# Patient Record
Sex: Female | Born: 1990 | Race: White | Hispanic: No | Marital: Married | Smoking: Never smoker
Health system: Southern US, Community
[De-identification: ages and names within clinical notes are randomized; demographics above are authoritative.]

## PROBLEM LIST (undated history)

## (undated) DIAGNOSIS — Z789 Other specified health status: Secondary | ICD-10-CM

## (undated) HISTORY — PX: BREAST REDUCTION SURGERY: SHX8

---
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
## 2009-12-25 ENCOUNTER — Other Ambulatory Visit: Payer: Self-pay | Admitting: Emergency Medicine

## 2009-12-25 ENCOUNTER — Inpatient Hospital Stay (HOSPITAL_COMMUNITY)
Admission: AD | Admit: 2009-12-25 | Discharge: 2009-12-25 | Payer: Self-pay | Source: Home / Self Care | Admitting: Obstetrics & Gynecology

## 2010-05-15 DIAGNOSIS — Z87442 Personal history of urinary calculi: Secondary | ICD-10-CM

## 2010-05-15 HISTORY — DX: Personal history of urinary calculi: Z87.442

## 2010-06-08 ENCOUNTER — Emergency Department (HOSPITAL_BASED_OUTPATIENT_CLINIC_OR_DEPARTMENT_OTHER)
Admission: EM | Admit: 2010-06-08 | Discharge: 2010-06-08 | Payer: Self-pay | Source: Home / Self Care | Admitting: Emergency Medicine

## 2010-06-08 LAB — URINE MICROSCOPIC-ADD ON

## 2010-06-08 LAB — URINALYSIS, ROUTINE W REFLEX MICROSCOPIC
Bilirubin Urine: NEGATIVE
Protein, ur: NEGATIVE mg/dL
Specific Gravity, Urine: 1.011 (ref 1.005–1.030)
Urine Glucose, Fasting: NEGATIVE mg/dL
pH: 6.5 (ref 5.0–8.0)

## 2010-07-19 ENCOUNTER — Emergency Department (HOSPITAL_BASED_OUTPATIENT_CLINIC_OR_DEPARTMENT_OTHER)
Admission: EM | Admit: 2010-07-19 | Discharge: 2010-07-19 | Disposition: A | Discharge status: Nursing Home (Includes ICF) | Payer: Medicaid Other | Source: Home / Self Care | Attending: Emergency Medicine | Admitting: Emergency Medicine

## 2010-07-19 ENCOUNTER — Inpatient Hospital Stay (HOSPITAL_COMMUNITY)
Admission: AD | Admit: 2010-07-19 | Discharge: 2010-07-22 | DRG: 781 | Disposition: A | Discharge status: Home / Self Care | Payer: Medicaid Other | Source: Other Acute Inpatient Hospital | Attending: Obstetrics | Admitting: Obstetrics

## 2010-07-19 DIAGNOSIS — M549 Dorsalgia, unspecified: Secondary | ICD-10-CM | POA: Insufficient documentation

## 2010-07-19 DIAGNOSIS — O2690 Pregnancy related conditions, unspecified, unspecified trimester: Secondary | ICD-10-CM | POA: Insufficient documentation

## 2010-07-19 DIAGNOSIS — N390 Urinary tract infection, site not specified: Secondary | ICD-10-CM | POA: Insufficient documentation

## 2010-07-19 DIAGNOSIS — N12 Tubulo-interstitial nephritis, not specified as acute or chronic: Secondary | ICD-10-CM | POA: Diagnosis present

## 2010-07-19 DIAGNOSIS — O2390 Unspecified genitourinary tract infection in pregnancy, unspecified trimester: Principal | ICD-10-CM | POA: Diagnosis present

## 2010-07-19 DIAGNOSIS — R109 Unspecified abdominal pain: Secondary | ICD-10-CM | POA: Diagnosis present

## 2010-07-19 LAB — CBC
Hemoglobin: 10.9 g/dL — ABNORMAL LOW (ref 12.0–15.0)
MCHC: 33.3 g/dL (ref 30.0–36.0)
MCV: 90.6 fL (ref 78.0–100.0)
RBC: 3.61 MIL/uL — ABNORMAL LOW (ref 3.87–5.11)
WBC: 20.9 10*3/uL — ABNORMAL HIGH (ref 4.0–10.5)

## 2010-07-19 LAB — URINALYSIS, ROUTINE W REFLEX MICROSCOPIC
Ketones, ur: NEGATIVE mg/dL
Specific Gravity, Urine: 1.01 (ref 1.005–1.030)
pH: 7.5 (ref 5.0–8.0)

## 2010-07-19 LAB — URINE MICROSCOPIC-ADD ON

## 2010-07-20 LAB — CBC
Hemoglobin: 10.7 g/dL — ABNORMAL LOW (ref 12.0–15.0)
MCV: 91.6 fL (ref 78.0–100.0)
WBC: 19 10*3/uL — ABNORMAL HIGH (ref 4.0–10.5)

## 2010-07-21 LAB — URINE CULTURE
Culture  Setup Time: 201203061305
Special Requests: POSITIVE

## 2010-07-21 LAB — CLOSTRIDIUM DIFFICILE BY PCR: Toxigenic C. Difficile by PCR: NEGATIVE

## 2010-07-29 LAB — GC/CHLAMYDIA PROBE AMP, GENITAL: Chlamydia, DNA Probe: NEGATIVE

## 2010-07-29 LAB — WET PREP, GENITAL: Yeast Wet Prep HPF POC: NONE SEEN

## 2010-08-02 NOTE — Discharge Summary (Signed)
  NAMEERVA, KOKE               ACCOUNT NO.:  [id number]  MEDICAL RECORD NO.:  [id number]           PATIENT TYPE:  I  LOCATION:  9157                          [facility]ACILITY:  [facility]  PHYSICIAN:  Sayeed Weatherall A. Clearance Coots, M.D.DATE OF BIRTH:  March 29, 1991  DATE OF ADMISSION:  07/19/2010 DATE OF DISCHARGE:  07/22/2010                              DISCHARGE SUMMARY   ADMITTING DIAGNOSIS:  Right pyelonephritis.  DISCHARGE DIAGNOSIS:  Right pyelonephritis, improved after IV antibiotic therapy.  REASON FOR ADMISSION:  A 19-year G1, estimated date of confinement August 24, 2010 presents with lower middle abdominal cramping and back pain and burning with urination.  Denies fever, chills, or nausea and vomiting.  PAST MEDICAL HISTORY:  None.  PAST SURGICAL HISTORY:  None.  ILLNESSES:  Epilepsy and syncopal.  MEDICATIONS:  Prenatal vitamins.  ALLERGIES:  No known drug allergies.  SOCIAL HISTORY:  Negative tobacco, alcohol, or recreational drug use. Single.  FAMILY HISTORY:  No major illnesses listed.  REVIEW OF SYSTEMS:  MUSCULOSKELETAL SYSTEM:  Back pain.  GENITOURINARY SYSTEM:  Dysuria.  PHYSICAL EXAMINATION:  GENERAL:  Well-nourished, well-developed female in no acute distress. VITAL SIGNS:  Afebrile, blood pressure 129/84. LUNGS:  Clear to auscultation bilaterally. HEART:  Regular rate and rhythm. BACK:  Positive right CVA tenderness.  Cervix 3 cm dilated, long.  ADMITTING LABS:  Hemoglobin 10, hematocrit 33, white blood cell count 20,000, platelets 228,000.  Urinalysis was positive for nitrites, moderate blood, large leukocytes.  HOSPITAL COURSE:  The patient was admitted and started on Rocephin IV, responded well to therapy.  Urine culture came back negative.  The patient developed diarrhea on hospital day #3 that resolved with p.o. Flagyl.  Clostridium difficile cultures came back negative.  Urine culture was negative.  The patient was much improved by hospital day #4 and was  discharged home undelivered at [redacted] weeks gestation, much improved in good condition.  DISCHARGE DISPOSITION:  Medications:  Continue prenatal vitamins.  The patient is to call office for followup appointment in 1 week.     Analiza Cowger A. Clearance Coots, M.D.     CAH/MEDQ  D:  07/22/2010  T:  07/23/2010  Job:  [id number]  Electronically Signed by Coral Ceo M.D. on 08/02/2010 01:31:22 PM

## 2010-08-14 ENCOUNTER — Inpatient Hospital Stay (HOSPITAL_COMMUNITY)
Admission: AD | Admit: 2010-08-14 | Discharge: 2010-08-16 | DRG: 775 | Disposition: A | Discharge status: Home / Self Care | Payer: Medicaid Other | Source: Ambulatory Visit | Attending: Obstetrics & Gynecology | Admitting: Obstetrics & Gynecology

## 2010-08-14 LAB — ABO/RH: ABO/RH(D): O POS

## 2010-08-14 LAB — CBC
HCT: 37.5 % (ref 36.0–46.0)
MCH: 30.4 pg (ref 26.0–34.0)
MCHC: 34.1 g/dL (ref 30.0–36.0)
Platelets: 251 10*3/uL (ref 150–400)
RBC: 4.21 MIL/uL (ref 3.87–5.11)
RDW: 13.2 % (ref 11.5–15.5)

## 2010-08-14 LAB — RPR: RPR Ser Ql: NONREACTIVE

## 2010-08-15 LAB — CBC
HCT: 30.3 % — ABNORMAL LOW (ref 36.0–46.0)
MCH: 29.6 pg (ref 26.0–34.0)
MCHC: 32.3 g/dL (ref 30.0–36.0)
RBC: 3.31 MIL/uL — ABNORMAL LOW (ref 3.87–5.11)
WBC: 15.6 10*3/uL — ABNORMAL HIGH (ref 4.0–10.5)

## 2010-08-17 NOTE — Op Note (Signed)
  NAMESHENOA, HATTABAUGH               ACCOUNT NO.:  [id number]  MEDICAL RECORD NO.:  [id number]           PATIENT TYPE:  I  LOCATION:  9162                          [facility]  [facility]  PHYSICIAN:  Roseanna Rainbow, M.D.DATE OF BIRTH:  1991-02-26  DATE OF PROCEDURE:  08/14/2010 DATE OF DISCHARGE:                              OPERATIVE REPORT   DELIVERY NOTE:  The patient was fully dilated and pushing.  There were repetitive severe variable decelerations.  There was a terminal bradycardia with the heart rate in the 80 beat per minute range. The decision was made to proceed with a vacuum-assisted delivery.  The patient was informed and consent was given in an expedited manner.  The position of the fetal head was direct occiput anterior.  The head was crowning.  The Kiwi vacuum was then applied and with one expulsive effort, the head was delivered as well as the anterior shoulder. Neonatology was present.  The cord was clamped and cut.  The infant was placed on the maternal abdomen and chest.  The placenta was delivered with cord traction intact with a three-vessel cord.  There was a right labial tear as well as second-degree perineal laceration.  These were repaired in the usual fashion using 2-0 and 3-0 Vicryl Rapide.  The estimated blood loss was less than 500 mL.  The mother and infant were in stable condition.     Roseanna Rainbow, M.D.     Judee Clara  D:  08/14/2010  T:  08/14/2010  Job:  [id number]  Electronically Signed by Antionette Char M.D. on 08/17/2010 10:14:01 PM

## 2010-09-11 ENCOUNTER — Emergency Department (INDEPENDENT_AMBULATORY_CARE_PROVIDER_SITE_OTHER): Payer: Medicaid Other

## 2010-09-11 ENCOUNTER — Emergency Department (HOSPITAL_BASED_OUTPATIENT_CLINIC_OR_DEPARTMENT_OTHER)
Admission: EM | Admit: 2010-09-11 | Discharge: 2010-09-11 | Disposition: A | Discharge status: Home / Self Care | Payer: Medicaid Other | Attending: Emergency Medicine | Admitting: Emergency Medicine

## 2010-09-11 DIAGNOSIS — N200 Calculus of kidney: Secondary | ICD-10-CM | POA: Insufficient documentation

## 2010-09-11 DIAGNOSIS — R109 Unspecified abdominal pain: Secondary | ICD-10-CM | POA: Insufficient documentation

## 2010-09-11 DIAGNOSIS — N201 Calculus of ureter: Secondary | ICD-10-CM

## 2010-09-11 LAB — CBC
MCH: 29.9 pg (ref 26.0–34.0)
Platelets: 320 10*3/uL (ref 150–400)
RBC: 4.11 MIL/uL (ref 3.87–5.11)

## 2010-09-11 LAB — URINALYSIS, ROUTINE W REFLEX MICROSCOPIC
Glucose, UA: NEGATIVE mg/dL
Ketones, ur: NEGATIVE mg/dL
Protein, ur: NEGATIVE mg/dL
Specific Gravity, Urine: 1.018 (ref 1.005–1.030)
pH: 5.5 (ref 5.0–8.0)

## 2010-09-11 LAB — DIFFERENTIAL
Eosinophils Absolute: 0.1 10*3/uL (ref 0.0–0.7)
Lymphocytes Relative: 30 % (ref 12–46)
Monocytes Relative: 9 % (ref 3–12)
Neutro Abs: 5.4 10*3/uL (ref 1.7–7.7)
Neutrophils Relative %: 60 % (ref 43–77)

## 2010-09-11 LAB — COMPREHENSIVE METABOLIC PANEL
CO2: 27 mEq/L (ref 19–32)
Calcium: 9 mg/dL (ref 8.4–10.5)
Chloride: 106 mEq/L (ref 96–112)
GFR calc non Af Amer: >60 mL/min (ref >60)
Total Bilirubin: 1 mg/dL (ref 0.3–1.2)

## 2010-09-11 LAB — URINE MICROSCOPIC-ADD ON

## 2011-06-10 ENCOUNTER — Ambulatory Visit (INDEPENDENT_AMBULATORY_CARE_PROVIDER_SITE_OTHER): Payer: No Typology Code available for payment source

## 2011-06-10 DIAGNOSIS — R1032 Left lower quadrant pain: Secondary | ICD-10-CM

## 2011-06-10 DIAGNOSIS — K5900 Constipation, unspecified: Secondary | ICD-10-CM

## 2011-09-07 ENCOUNTER — Emergency Department (INDEPENDENT_AMBULATORY_CARE_PROVIDER_SITE_OTHER): Payer: No Typology Code available for payment source

## 2011-09-07 ENCOUNTER — Emergency Department (HOSPITAL_BASED_OUTPATIENT_CLINIC_OR_DEPARTMENT_OTHER)
Admission: EM | Admit: 2011-09-07 | Discharge: 2011-09-07 | Disposition: A | Discharge status: Home / Self Care | Payer: No Typology Code available for payment source | Attending: Emergency Medicine | Admitting: Emergency Medicine

## 2011-09-07 ENCOUNTER — Encounter (HOSPITAL_BASED_OUTPATIENT_CLINIC_OR_DEPARTMENT_OTHER): Payer: Self-pay | Admitting: *Deleted

## 2011-09-07 DIAGNOSIS — IMO0002 Reserved for concepts with insufficient information to code with codable children: Secondary | ICD-10-CM | POA: Insufficient documentation

## 2011-09-07 DIAGNOSIS — S60222A Contusion of left hand, initial encounter: Secondary | ICD-10-CM

## 2011-09-07 DIAGNOSIS — Z043 Encounter for examination and observation following other accident: Secondary | ICD-10-CM

## 2011-09-07 DIAGNOSIS — S0990XA Unspecified injury of head, initial encounter: Secondary | ICD-10-CM | POA: Insufficient documentation

## 2011-09-07 DIAGNOSIS — M79609 Pain in unspecified limb: Secondary | ICD-10-CM

## 2011-09-07 DIAGNOSIS — R404 Transient alteration of awareness: Secondary | ICD-10-CM

## 2011-09-07 DIAGNOSIS — S1093XA Contusion of unspecified part of neck, initial encounter: Secondary | ICD-10-CM | POA: Insufficient documentation

## 2011-09-07 DIAGNOSIS — S5010XA Contusion of unspecified forearm, initial encounter: Secondary | ICD-10-CM | POA: Insufficient documentation

## 2011-09-07 DIAGNOSIS — S8001XA Contusion of right knee, initial encounter: Secondary | ICD-10-CM

## 2011-09-07 DIAGNOSIS — R51 Headache: Secondary | ICD-10-CM

## 2011-09-07 DIAGNOSIS — S8000XA Contusion of unspecified knee, initial encounter: Secondary | ICD-10-CM | POA: Insufficient documentation

## 2011-09-07 DIAGNOSIS — M25569 Pain in unspecified knee: Secondary | ICD-10-CM

## 2011-09-07 DIAGNOSIS — S0003XA Contusion of scalp, initial encounter: Secondary | ICD-10-CM | POA: Insufficient documentation

## 2011-09-07 DIAGNOSIS — S8002XA Contusion of left knee, initial encounter: Secondary | ICD-10-CM

## 2011-09-07 DIAGNOSIS — M25539 Pain in unspecified wrist: Secondary | ICD-10-CM | POA: Insufficient documentation

## 2011-09-07 DIAGNOSIS — S5012XA Contusion of left forearm, initial encounter: Secondary | ICD-10-CM

## 2011-09-07 DIAGNOSIS — S60229A Contusion of unspecified hand, initial encounter: Secondary | ICD-10-CM | POA: Insufficient documentation

## 2011-09-07 MED ORDER — HYDROCODONE-ACETAMINOPHEN 5-500 MG PO TABS: 1.0000 | ORAL_TABLET | Freq: Four times a day (QID) | ORAL | Status: AC | PRN
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
## 2011-09-07 NOTE — ED Notes (Signed)
MD at bedside. 
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
## 2021-04-14 HISTORY — PX: BREAST REDUCTION SURGERY: SHX8

## 2022-04-03 ENCOUNTER — Encounter (HOSPITAL_COMMUNITY): Payer: Self-pay | Admitting: Family Medicine

## 2022-04-03 ENCOUNTER — Inpatient Hospital Stay (HOSPITAL_COMMUNITY)
Admission: AD | Admit: 2022-04-03 | Discharge: 2022-04-03 | Disposition: A | Discharge status: Home / Self Care | Payer: 59 | Attending: Family Medicine | Admitting: Family Medicine

## 2022-04-03 ENCOUNTER — Inpatient Hospital Stay (HOSPITAL_COMMUNITY): Payer: 59

## 2022-04-03 DIAGNOSIS — A5901 Trichomonal vulvovaginitis: Secondary | ICD-10-CM | POA: Diagnosis not present

## 2022-04-03 DIAGNOSIS — O209 Hemorrhage in early pregnancy, unspecified: Secondary | ICD-10-CM | POA: Diagnosis present

## 2022-04-03 DIAGNOSIS — O2691 Pregnancy related conditions, unspecified, first trimester: Secondary | ICD-10-CM | POA: Diagnosis not present

## 2022-04-03 DIAGNOSIS — Z3A12 12 weeks gestation of pregnancy: Secondary | ICD-10-CM | POA: Insufficient documentation

## 2022-04-03 HISTORY — DX: Other specified health status: Z78.9

## 2022-04-03 LAB — URINALYSIS, ROUTINE W REFLEX MICROSCOPIC
Bacteria, UA: NONE SEEN
Bilirubin Urine: NEGATIVE
Glucose, UA: NEGATIVE mg/dL
Ketones, ur: NEGATIVE mg/dL
Nitrite: NEGATIVE
Protein, ur: NEGATIVE mg/dL
Specific Gravity, Urine: 1.018 (ref 1.005–1.030)
pH: 5 (ref 5.0–8.0)

## 2022-04-03 LAB — WET PREP, GENITAL
Clue Cells Wet Prep HPF POC: NONE SEEN
Sperm: NONE SEEN
WBC, Wet Prep HPF POC: <10 (ref <10)
Yeast Wet Prep HPF POC: NONE SEEN

## 2022-04-03 LAB — CBC
HCT: 40.6 % (ref 36.0–46.0)
Hemoglobin: 13.8 g/dL (ref 12.0–15.0)
MCH: 31.1 pg (ref 26.0–34.0)
MCHC: 34 g/dL (ref 30.0–36.0)
MCV: 91.4 fL (ref 80.0–100.0)
Platelets: 314 10*3/uL (ref 150–400)
RBC: 4.44 MIL/uL (ref 3.87–5.11)
RDW: 11.4 % — ABNORMAL LOW (ref 11.5–15.5)
WBC: 10.6 10*3/uL — ABNORMAL HIGH (ref 4.0–10.5)
nRBC: 0 % (ref 0.0–0.2)

## 2022-04-03 LAB — HCG, QUANTITATIVE, PREGNANCY: hCG, Beta Chain, Quant, S: 2671 m[IU]/mL — ABNORMAL HIGH (ref <5)

## 2022-04-03 MED ORDER — METRONIDAZOLE 500 MG PO TABS
2000.0000 mg | ORAL_TABLET | Freq: Once | ORAL | Status: AC
  Administered 2022-04-03: 2000 mg via ORAL
  Filled 2022-04-03: qty 4

## 2022-04-03 NOTE — MAU Note (Signed)
.  Monica Leonard is a 31 y.o. at [redacted]w[redacted]d here in MAU reporting: u/s at 3-4 weeks at pregnancy care center showed ? Twins, repeat u/s last week shows two sacks but still no ? Fetal pole. Denies pain, light spotting since yesterday pm. Only sees the bleeding when she wipes.   Onset of complaint: yesterday Pain score: 0/10 Vitals:   04/03/22 1406  BP: (!) 143/87  Pulse: 98  Resp: 15  Temp: 98.8 F (37.1 C)  SpO2: 98%     FHT:na Lab orders placed from triage:  urine

## 2022-04-03 NOTE — MAU Provider Note (Cosign Needed Addendum)
History     CSN: [id number]  Arrival date and time: 04/03/22 1336   Event Date/Time   First Provider Initiated Contact with Patient 04/03/22 1422      Chief Complaint  Patient presents with   Vaginal Bleeding   Monica Leonard is a 31 y.o. G2P1001 female at [redacted]w[redacted]d by LMP who presents for light vaginal bleeding. She noticed it yesterday incidentally while wiping. She describes the bleeding as very light red. She has not had any pain or cramping. She recently had sexual intercourse on Saturday, 11/18. Of note, she saw [facility] and had an ultrasound that she was told shows potentially two gestational sacs and no fetal pole. Today, she would like an additional ultrasound and beta hCG levels to further assess her pregnancy.  OB History     Gravida  2   Para  1   Term  1   Preterm      AB      Living  1      SAB      IAB      Ectopic      Multiple      Live Births  1           Past Medical History:  Diagnosis Date   Medical history non-contributory     Past Surgical History:  Procedure Laterality Date   BREAST REDUCTION SURGERY Bilateral     No family history on file.  Social History   Tobacco Use   Smoking status: Never  Vaping Use   Vaping Use: Never used  Substance Use Topics   Alcohol use: No   Drug use: No    Allergies: No Known Allergies  Medications Prior to Admission  Medication Sig Dispense Refill Last Dose   Prenatal Vit-Fe Fumarate-FA (MULTIVITAMIN-PRENATAL) 27-0.8 MG TABS tablet Take 1 tablet by mouth daily at 12 noon.   04/03/2022   ROS as per HPI.  Physical Exam   Blood pressure (!) 143/87, pulse 98, temperature 98.8 F (37.1 C), temperature source Oral, resp. rate 15, height 5\' 4"  (1.626 m), weight 82.6 kg, last menstrual period 01/08/2022, SpO2 98 %.  Physical Exam Constitutional:      General: She is not in acute distress.    Appearance: Normal appearance. She is not ill-appearing or toxic-appearing.   HENT:     Head: Normocephalic and atraumatic.  Eyes:     Extraocular Movements: Extraocular movements intact.  Cardiovascular:     Rate and Rhythm: Normal rate and regular rhythm.     Heart sounds: No murmur heard.    No gallop.  Pulmonary:     Effort: Pulmonary effort is normal. No respiratory distress.     Breath sounds: Normal breath sounds.  Abdominal:     General: Bowel sounds are normal.     Palpations: Abdomen is soft.     Tenderness: There is no abdominal tenderness.  Skin:    General: Skin is warm and dry.  Neurological:     Mental Status: She is alert.  Psychiatric:        Mood and Affect: Mood normal.        Behavior: Behavior normal.    Assessment and Plan  Monica Leonard is a 31 y.o. G2P1001 female at [redacted]w[redacted]d by LMP who presents for light vaginal bleeding. She is asymptomatic. I suspect her light spotting is 2/2 cervical changes in pregnancy +/- postcoital. Do not suspect loss of pregnancy, placental abruption, vasa/placenta previa given she is  without pain and more substantial blood loss is absent. Given she will not be seen until next month for her OB and her preferences, will proceed with initial pregnancy workup.  -UA -CBC -beta hCG quant -GC/CL and wet prep -Transab/transvaginal US  UA and wet prep with trichomonas which could also be source of vaginal spotting. Discussed trichomonas and how it is spread with patient. [place] read with multiple endometrial cysts/small loculated collections, which could represent a molar pregnancy or multiple gestational sacs. No fetal pole, yolk sac, or cardiac activity visualized.  -PO metronidazole 2g once per patient preference -Informational materials on trich provided -Recommend husband be tested and treated if indicated  Quant beta hCG 5920 on 10/20 and now 2671 on check today. Most likely failed pregnancy vs molar pregnancy given declining and not severely elevated hCG. Discussed with patient need for D&C to evacuate and  test tissue; scheduler notified. Also discussed follow up planning with [facility] after the procedure for trich test of cure.  Patient is stable for discharge.  Monica Holmes, MD 04/03/2022, 2:46 PM     Attestation of Supervision of Student:  I confirm that I have verified the information documented in the  resident's . I have verified that all services and findings are accurately documented in this student's note; and I agree with management and plan as outlined in the documentation. I have also made any necessary editorial changes.  RH positive Per Care everywhere patient had HCG at urgent care on 10/20 of 5926.  Reviewed HCG & imaging with Dr. Adrian Blackwater who recommends D&E & to have tissue tested for possible molar pregnancy.   Judeth Horn, NP [facility] [employer], [facility] 04/03/2022 5:45 PM

## 2022-04-03 NOTE — Discharge Instructions (Signed)
Return to care  If you have heavier bleeding that soaks through more than 2 pads per hour for an hour or more If you bleed so much that you feel like you might pass out or you do pass out If you have significant abdominal pain that is not improved with Tylenol   

## 2022-04-04 ENCOUNTER — Telehealth: Payer: Self-pay

## 2022-04-04 ENCOUNTER — Other Ambulatory Visit (HOSPITAL_COMMUNITY): Payer: Self-pay | Admitting: Obstetrics and Gynecology

## 2022-04-04 ENCOUNTER — Other Ambulatory Visit: Payer: Self-pay

## 2022-04-04 DIAGNOSIS — O020 Blighted ovum and nonhydatidiform mole: Secondary | ICD-10-CM

## 2022-04-04 LAB — GC/CHLAMYDIA PROBE AMP (~~LOC~~) NOT AT ARMC
Chlamydia: NEGATIVE
Comment: NEGATIVE
Comment: NORMAL
Neisseria Gonorrhea: NEGATIVE

## 2022-04-04 MED ORDER — DOXYCYCLINE HYCLATE 100 MG IV SOLR
200.0000 mg | INTRAVENOUS | Status: AC
  Administered 2022-04-05: 200 mg via INTRAVENOUS
  Filled 2022-04-04: qty 200

## 2022-04-04 NOTE — Progress Notes (Signed)
Mrs Monica Leonard denies chest pain or shortness of breath.  Patient denies having any s/s of Covid in her household, also denies any known exposure to Covid.   Bridie's PCP is in [facility].

## 2022-04-04 NOTE — Telephone Encounter (Signed)
Called patient, no answer, left voicemail with surgery date, time, location, preop instructions and call back number. 

## 2022-04-05 ENCOUNTER — Ambulatory Visit (HOSPITAL_BASED_OUTPATIENT_CLINIC_OR_DEPARTMENT_OTHER): Payer: 59 | Admitting: Certified Registered"

## 2022-04-05 ENCOUNTER — Ambulatory Visit (HOSPITAL_COMMUNITY)
Admission: RE | Admit: 2022-04-05 | Discharge: 2022-04-05 | Disposition: A | Discharge status: Home / Self Care | Payer: 59 | Source: Ambulatory Visit | Attending: Obstetrics and Gynecology | Admitting: Obstetrics and Gynecology

## 2022-04-05 ENCOUNTER — Encounter (HOSPITAL_COMMUNITY)
Admission: RE | Disposition: A | Discharge status: Home / Self Care | Payer: Self-pay | Source: Ambulatory Visit | Attending: Obstetrics and Gynecology

## 2022-04-05 ENCOUNTER — Ambulatory Visit (HOSPITAL_COMMUNITY): Payer: 59 | Admitting: Certified Registered"

## 2022-04-05 DIAGNOSIS — O020 Blighted ovum and nonhydatidiform mole: Secondary | ICD-10-CM

## 2022-04-05 DIAGNOSIS — O021 Missed abortion: Secondary | ICD-10-CM | POA: Diagnosis not present

## 2022-04-05 DIAGNOSIS — Z01812 Encounter for preprocedural laboratory examination: Secondary | ICD-10-CM

## 2022-04-05 HISTORY — PX: DILATION AND EVACUATION: SHX1459

## 2022-04-05 HISTORY — PX: OPERATIVE ULTRASOUND: SHX5996

## 2022-04-05 LAB — TYPE AND SCREEN
ABO/RH(D): O POS
Antibody Screen: NEGATIVE

## 2022-04-05 LAB — ABO/RH: ABO/RH(D): O POS

## 2022-04-05 SURGERY — DILATION AND EVACUATION, UTERUS
Anesthesia: General

## 2022-04-05 MED ORDER — ONDANSETRON HCL 4 MG/2ML IJ SOLN: 4.0000 mg | Freq: Once | INTRAMUSCULAR | Status: DC | PRN

## 2022-04-05 MED ORDER — ONDANSETRON HCL 4 MG/2ML IJ SOLN
INTRAMUSCULAR | Status: AC
  Filled 2022-04-05: qty 2

## 2022-04-05 MED ORDER — OXYCODONE-ACETAMINOPHEN 5-325 MG PO TABS: 1.0000 | ORAL_TABLET | Freq: Four times a day (QID) | ORAL | 0 refills | Status: AC | PRN

## 2022-04-05 MED ORDER — MIDAZOLAM HCL 2 MG/2ML IJ SOLN
INTRAMUSCULAR | Status: AC
  Filled 2022-04-05: qty 2

## 2022-04-05 MED ORDER — FENTANYL CITRATE (PF) 250 MCG/5ML IJ SOLN
INTRAMUSCULAR | Status: AC
  Filled 2022-04-05: qty 5

## 2022-04-05 MED ORDER — CHLORHEXIDINE GLUCONATE 0.12 % MT SOLN: 15.0000 mL | Freq: Once | OROMUCOSAL | Status: AC

## 2022-04-05 MED ORDER — FENTANYL CITRATE (PF) 250 MCG/5ML IJ SOLN
INTRAMUSCULAR | Status: DC | PRN
  Administered 2022-04-05: 100 ug via INTRAVENOUS
  Administered 2022-04-05: 50 ug via INTRAVENOUS

## 2022-04-05 MED ORDER — MIDAZOLAM HCL 2 MG/2ML IJ SOLN
INTRAMUSCULAR | Status: DC | PRN
  Administered 2022-04-05: 2 mg via INTRAVENOUS

## 2022-04-05 MED ORDER — CHLOROPROCAINE HCL 1 % IJ SOLN
INTRAMUSCULAR | Status: DC | PRN
  Administered 2022-04-05: 5 mL

## 2022-04-05 MED ORDER — LIDOCAINE 2% (20 MG/ML) 5 ML SYRINGE
INTRAMUSCULAR | Status: AC
  Filled 2022-04-05: qty 5

## 2022-04-05 MED ORDER — DEXAMETHASONE SODIUM PHOSPHATE 10 MG/ML IJ SOLN
INTRAMUSCULAR | Status: AC
  Filled 2022-04-05: qty 1

## 2022-04-05 MED ORDER — FENTANYL CITRATE (PF) 100 MCG/2ML IJ SOLN: 25.0000 ug | INTRAMUSCULAR | Status: DC | PRN

## 2022-04-05 MED ORDER — LACTATED RINGERS IV SOLN: INTRAVENOUS | Status: DC

## 2022-04-05 MED ORDER — IBUPROFEN 600 MG PO TABS: 600.0000 mg | ORAL_TABLET | Freq: Four times a day (QID) | ORAL | 3 refills | Status: AC | PRN

## 2022-04-05 MED ORDER — ORAL CARE MOUTH RINSE: 15.0000 mL | Freq: Once | OROMUCOSAL | Status: AC

## 2022-04-05 MED ORDER — LIDOCAINE 2% (20 MG/ML) 5 ML SYRINGE
INTRAMUSCULAR | Status: DC | PRN
  Administered 2022-04-05: 100 mg via INTRAVENOUS

## 2022-04-05 MED ORDER — CHLORHEXIDINE GLUCONATE 0.12 % MT SOLN
OROMUCOSAL | Status: AC
  Administered 2022-04-05: 15 mL via OROMUCOSAL
  Filled 2022-04-05: qty 15

## 2022-04-05 MED ORDER — DEXAMETHASONE SODIUM PHOSPHATE 10 MG/ML IJ SOLN
INTRAMUSCULAR | Status: DC | PRN
  Administered 2022-04-05: 10 mg via INTRAVENOUS

## 2022-04-05 MED ORDER — CHLOROPROCAINE HCL 1 % IJ SOLN
INTRAMUSCULAR | Status: AC
  Filled 2022-04-05: qty 30

## 2022-04-05 MED ORDER — OXYCODONE HCL 5 MG/5ML PO SOLN: 5.0000 mg | Freq: Once | ORAL | Status: DC | PRN

## 2022-04-05 MED ORDER — ROCURONIUM BROMIDE 10 MG/ML (PF) SYRINGE
PREFILLED_SYRINGE | INTRAVENOUS | Status: AC
  Filled 2022-04-05: qty 10

## 2022-04-05 MED ORDER — PROPOFOL 10 MG/ML IV BOLUS
INTRAVENOUS | Status: AC
  Filled 2022-04-05: qty 20

## 2022-04-05 MED ORDER — POVIDONE-IODINE 10 % EX SWAB
2.0000 | Freq: Once | CUTANEOUS | Status: AC
  Administered 2022-04-05: 2 via TOPICAL

## 2022-04-05 MED ORDER — OXYCODONE HCL 5 MG PO TABS: 5.0000 mg | ORAL_TABLET | Freq: Once | ORAL | Status: DC | PRN

## 2022-04-05 MED ORDER — PROPOFOL 10 MG/ML IV BOLUS
INTRAVENOUS | Status: DC | PRN
  Administered 2022-04-05: 200 mg via INTRAVENOUS

## 2022-04-05 MED ORDER — ONDANSETRON HCL 4 MG/2ML IJ SOLN
INTRAMUSCULAR | Status: DC | PRN
  Administered 2022-04-05: 4 mg via INTRAVENOUS

## 2022-04-05 MED ORDER — KETOROLAC TROMETHAMINE 30 MG/ML IJ SOLN: 30.0000 mg | Freq: Once | INTRAMUSCULAR | Status: AC | PRN

## 2022-04-05 SURGICAL SUPPLY — 22 items
CATH ROBINSON RED A/P 16FR (CATHETERS) ×1 IMPLANT
FILTER UTR ASPR ASSEMBLY (MISCELLANEOUS) ×1 IMPLANT
GLOVE BIOGEL PI IND STRL 6.5 (GLOVE) ×1 IMPLANT
GLOVE BIOGEL PI IND STRL 7.0 (GLOVE) ×1 IMPLANT
GLOVE SURG SS PI 6.5 STRL IVOR (GLOVE) ×1 IMPLANT
GOWN STRL REUS W/ TWL LRG LVL3 (GOWN DISPOSABLE) ×2 IMPLANT
GOWN STRL REUS W/TWL LRG LVL3 (GOWN DISPOSABLE) ×2
HOSE CONNECTING 18IN BERKELEY (TUBING) ×1 IMPLANT
KIT BERKELEY 1ST TRI 3/8 NO TR (MISCELLANEOUS) ×1 IMPLANT
KIT BERKELEY 1ST TRIMESTER 3/8 (MISCELLANEOUS) ×1 IMPLANT
NS IRRIG 1000ML POUR BTL (IV SOLUTION) ×1 IMPLANT
PACK VAGINAL MINOR WOMEN LF (CUSTOM PROCEDURE TRAY) ×1 IMPLANT
PAD OB MATERNITY 4.3X12.25 (PERSONAL CARE ITEMS) ×1 IMPLANT
SET BERKELEY SUCTION TUBING (SUCTIONS) ×1 IMPLANT
SPIKE FLUID TRANSFER (MISCELLANEOUS) ×1 IMPLANT
TOWEL GREEN STERILE FF (TOWEL DISPOSABLE) ×2 IMPLANT
UNDERPAD 30X36 HEAVY ABSORB (UNDERPADS AND DIAPERS) ×1 IMPLANT
VACURETTE 10 RIGID CVD (CANNULA) IMPLANT
VACURETTE 6 ASPIR F TIP BERK (CANNULA) IMPLANT
VACURETTE 7MM CVD STRL WRAP (CANNULA) IMPLANT
VACURETTE 8 RIGID CVD (CANNULA) IMPLANT
VACURETTE 9 RIGID CVD (CANNULA) IMPLANT

## 2022-04-05 NOTE — Anesthesia Postprocedure Evaluation (Signed)
Anesthesia Post Note  Patient: Monica Leonard  Procedure(s) Performed: DILATATION AND EVACUATION OPERATIVE ULTRASOUND     Patient location during evaluation: PACU Anesthesia Type: General Level of consciousness: awake and alert Pain management: pain level controlled Vital Signs Assessment: post-procedure vital signs reviewed and stable Respiratory status: spontaneous breathing, nonlabored ventilation, respiratory function stable and patient connected to nasal cannula oxygen Cardiovascular status: blood pressure returned to baseline and stable Postop Assessment: no apparent nausea or vomiting Anesthetic complications: no  No notable events documented.  Last Vitals:  Vitals:   04/05/22 1145 04/05/22 1200  BP: (!) 130/96 (!) 118/96  Pulse: 85 75  Resp: 16 16  Temp:  36.7 C  SpO2: 94% 93%    Last Pain:  Vitals:   04/05/22 1145  TempSrc:   PainSc: 0-No pain                 Cass Vandermeulen S

## 2022-04-05 NOTE — H&P (Signed)
Monica Leonard is an 31 y.o. female P1001 here for dilatation and evacuation. Patient was diagnosed with a pregnancy by ultrasound with pregnancy care which demonstrated 2 gestational sac and no fetal poles. She reports some intermitted vaginal bleeding noted when she wipes which is now darker in color. Recent ultrasound failed to show development of fetal pole and is concerning for possible molar pregnancy. Patient is without any complaints. She denies any pelvic pain   Menstrual History: Patient's last menstrual period was 01/08/2022 (approximate).    Past Medical History:  Diagnosis Date   History of kidney stones 2012   passed   Medical history non-contributory     Past Surgical History:  Procedure Laterality Date   BREAST REDUCTION SURGERY Bilateral 04/2021    History reviewed. No pertinent family history.  Social History:  reports that she has never smoked. She has never used smokeless tobacco. She reports that she does not drink alcohol and does not use drugs.  Allergies: No Known Allergies  Medications Prior to Admission  Medication Sig Dispense Refill Last Dose   Prenatal Vit-Fe Fumarate-FA (MULTIVITAMIN-PRENATAL) 27-0.8 MG TABS tablet Take 1 tablet by mouth daily at 12 noon.   Past Week    Review of Systems See pertinent in HPI. All other systems reviewed and non contributory Blood pressure (!) 135/95, pulse (!) 107, temperature 97.9 F (36.6 C), temperature source Oral, resp. rate 18, height 5\' 4"  (1.626 m), weight 79.4 kg, last menstrual period 01/08/2022, SpO2 95 %. Physical Exam GENERAL: Well-developed, well-nourished female in no acute distress.  LUNGS: Clear to auscultation bilaterally.  HEART: Regular rate and rhythm. ABDOMEN: Soft, nontender, nondistended. No organomegaly. PELVIC: Deferred to OR EXTREMITIES: No cyanosis, clubbing, or edema, 2+ distal pulses.  Results for orders placed or performed during the hospital encounter of 04/05/22 (from the past 24  hour(s))  Type and screen [facility]     Status: None (Preliminary result)   Collection Time: 04/05/22  9:00 AM  Result Value Ref Range   ABO/RH(D) PENDING    Antibody Screen PENDING    Sample Expiration      04/08/2022,2359 Performed at [facility], [address] [place], [place]     [address] OB LESS THAN 14 WEEKS WITH OB TRANSVAGINAL  Result Date: 04/03/2022 CLINICAL DATA:  04/05/2022 Vaginal bleeding in pregnancy, first trimester EXAM: OBSTETRIC <14 WK [id number] AND TRANSVAGINAL OB US TECHNIQUE: Both transabdominal and transvaginal ultrasound examinations were performed for complete evaluation of the gestation as well as the maternal uterus, adnexal regions, and pelvic cul-de-sac. Transvaginal technique was performed to assess early pregnancy. COMPARISON:  None Available. FINDINGS: Intrauterine gestational sac: No definite intrauterine gestational sac visualized. There are 4-6 cystic areas in the endometrium. Yolk sac:  Not Visualized. Embryo:  Not Visualized. Cardiac Activity: Not Visualized. Heart Rate: Not applicable MSD: Not applicable CRL:  Not applicable Subchorionic hemorrhage:  Not applicable. Maternal uterus/adnexae: Normal ovaries.  No free fluid. IMPRESSION: Multiple endometrial cysts/small loculated collections, which could represent a molar pregnancy or multiple gestational sacs. No fetal pole, yolk sac, or cardiac activity visualized. Correlate with history of fertility drugs and trend beta hCG with repeat ultrasound as clinically indicated. Electronically Signed   By: [place] M.D.   On: 04/03/2022 15:46    Assessment/Plan: 31 yo here for scheduled D&E - Risks, benefits and alternatives were explained including but not limited to risks of bleeding, infection, uterine perforation and damage to adjacent organs. Patient verbalized understanding and  all questions were answered  Monica Leonard 04/05/2022, 9:35 AM

## 2022-04-05 NOTE — Anesthesia Procedure Notes (Signed)
Procedure Name: LMA Insertion Date/Time: 04/05/2022 10:56 AM  Performed by: Rosiland Oz, CRNAPre-anesthesia Checklist: Patient identified, Emergency Drugs available, Suction available, Patient being monitored and Timeout performed Patient Re-evaluated:Patient Re-evaluated prior to induction Oxygen Delivery Method: Circle system utilized Preoxygenation: Pre-oxygenation with 100% oxygen Induction Type: IV induction LMA: LMA inserted LMA Size: 3.0 Number of attempts: 1 Placement Confirmation: positive ETCO2 and breath sounds checked- equal and bilateral Tube secured with: Tape Dental Injury: Teeth and Oropharynx as per pre-operative assessment

## 2022-04-05 NOTE — Transfer of Care (Signed)
Immediate Anesthesia Transfer of Care Note  Patient: Monica Leonard  Procedure(s) Performed: DILATATION AND EVACUATION OPERATIVE ULTRASOUND  Patient Location: PACU  Anesthesia Type:General  Level of Consciousness: drowsy and patient cooperative  Airway & Oxygen Therapy: Patient Spontanous Breathing  Post-op Assessment: Report given to RN and Post -op Vital signs reviewed and stable  Post vital signs: Reviewed and stable  Last Vitals:  Vitals Value Taken Time  BP 146/108 04/05/22 1125  Temp    Pulse 95 04/05/22 1126  Resp    SpO2 95 % 04/05/22 1126  Vitals shown include unvalidated device data.  Last Pain:  Vitals:   04/05/22 0915  TempSrc:   PainSc: 0-No pain         Complications: No notable events documented.

## 2022-04-05 NOTE — Op Note (Signed)
Monica Leonard PROCEDURE DATE: 04/05/2022  PREOPERATIVE DIAGNOSIS: Missed abortion vs molar pregnancy. POSTOPERATIVE DIAGNOSIS: The same. PROCEDURE:     Dilation and Evacuation under ultrasound guidance SURGEON:  Dr. Jolayne Panther  INDICATIONS: 31 y.o. G2P1001 with MAB and findings suspicious for molar pregnancy, needing surgical completion.  Risks of surgery were discussed with the patient including but not limited to: bleeding which may require transfusion; infection which may require antibiotics; injury to uterus or surrounding organs;need for additional procedures including laparotomy or laparoscopy; possibility of intrauterine scarring which may impair future fertility; and other postoperative/anesthesia complications. Written informed consent was obtained.    FINDINGS:  A 9-week size anteverted uterus, moderate amounts of products of conception, specimen sent to pathology.  ANESTHESIA:    Monitored intravenous sedation, paracervical block. INTRAVENOUS FLUIDS:  400 ml of LR ESTIMATED BLOOD LOSS:  Less than 20 ml. SPECIMENS:  Products of conception sent to pathology COMPLICATIONS:  None immediate.  PROCEDURE DETAILS:  The patient received intravenous antibiotics while in the preoperative area.  She was then taken to the operating room where general anesthesia was administered and was found to be adequate.  After an adequate timeout was performed, she was placed in the dorsal lithotomy position and examined; then prepped and draped in the sterile manner.   Her bladder was catheterized for an unmeasured amount of clear, yellow urine. A vaginal speculum was then placed in the patient's vagina and a single tooth tenaculum was applied to the anterior lip of the cervix.  A paracervical block using 0.5% Marcaine was administered. The cervix was gently dilated to accommodate a 9 mm suction curette that was gently advanced to the uterine fundus.  The suction device was then activated and curette slowly  rotated to clear the uterus of products of conception.  A sharp curettage was then performed to confirm complete emptying of the uterus. Bedside ultrasound revealed a thin endometrial stripe. There was minimal bleeding noted and the tenaculum removed with good hemostasis noted.   All instruments were removed from the patient's vagina. The patient tolerated the procedure well and was taken to the recovery area awake, and in stable condition.  The patient will be discharged to home as per PACU criteria.  Routine postoperative instructions given.  She will follow up in the clinic in 2 weeks for postoperative evaluation.

## 2022-04-05 NOTE — Anesthesia Preprocedure Evaluation (Signed)
Anesthesia Evaluation  Patient identified by MRN, date of birth, ID band Patient awake    Reviewed: Allergy & Precautions, H&P , NPO status , Patient's Chart, lab work & pertinent test results  Airway Mallampati: II  TM Distance: >3 FB Neck ROM: Full    Dental no notable dental hx.    Pulmonary neg pulmonary ROS   Pulmonary exam normal breath sounds clear to auscultation       Cardiovascular negative cardio ROS Normal cardiovascular exam Rhythm:Regular Rate:Normal     Neuro/Psych negative neurological ROS  negative psych ROS   GI/Hepatic negative GI ROS, Neg liver ROS,,,  Endo/Other  negative endocrine ROS    Renal/GU negative Renal ROS  negative genitourinary   Musculoskeletal negative musculoskeletal ROS (+)    Abdominal   Peds negative pediatric ROS (+)  Hematology negative hematology ROS (+)   Anesthesia Other Findings   Reproductive/Obstetrics negative OB ROS                             Anesthesia Physical Anesthesia Plan  ASA: 1  Anesthesia Plan: General   Post-op Pain Management: Minimal or no pain anticipated   Induction: Intravenous  PONV Risk Score and Plan: 3 and Ondansetron, Dexamethasone, Midazolam and Treatment may vary due to age or medical condition  Airway Management Planned: LMA  Additional Equipment:   Intra-op Plan:   Post-operative Plan: Extubation in OR  Informed Consent: I have reviewed the patients History and Physical, chart, labs and discussed the procedure including the risks, benefits and alternatives for the proposed anesthesia with the patient or authorized representative who has indicated his/her understanding and acceptance.     Dental advisory given  Plan Discussed with: CRNA and Surgeon  Anesthesia Plan Comments:        Anesthesia Quick Evaluation

## 2022-04-06 ENCOUNTER — Encounter (HOSPITAL_COMMUNITY): Payer: Self-pay | Admitting: Obstetrics and Gynecology

## 2022-04-11 ENCOUNTER — Encounter: Payer: Self-pay | Admitting: General Practice

## 2022-04-11 ENCOUNTER — Telehealth: Payer: Self-pay | Admitting: General Practice

## 2022-04-11 NOTE — Telephone Encounter (Signed)
Left message on VM informing patient of upcoming appt on Friday, 04/28/2022 at 9:55am.  Asked patient to contact our office to confirm receipt of message.

## 2022-04-11 NOTE — Telephone Encounter (Signed)
-----   Message from Catalina Antigua, MD sent at 04/05/2022 11:19 AM EST ----- Please schedule post op appointment with any MD in 2-3 weeks. Patient had a dilatation and evacuation on 11/22  Thanks  Peggy

## 2022-04-18 ENCOUNTER — Encounter (HOSPITAL_COMMUNITY): Payer: Self-pay

## 2022-04-28 ENCOUNTER — Encounter: Payer: 59 | Admitting: Obstetrics and Gynecology

## 2022-05-05 ENCOUNTER — Ambulatory Visit (INDEPENDENT_AMBULATORY_CARE_PROVIDER_SITE_OTHER): Payer: 59 | Admitting: Obstetrics and Gynecology

## 2022-05-05 VITALS — BP 135/79 | HR 78 | Ht 64.0 in | Wt 180.0 lb

## 2022-05-05 DIAGNOSIS — Z9889 Other specified postprocedural states: Secondary | ICD-10-CM

## 2022-05-05 MED ORDER — NORGESTIMATE-ETH ESTRADIOL 0.25-35 MG-MCG PO TABS: 1.0000 | ORAL_TABLET | Freq: Every day | ORAL | 11 refills | Status: AC

## 2022-05-05 NOTE — Progress Notes (Signed)
Patient has not had any bleeding since D &E.Patient would like refill of birth control.Armandina Stammer RN

## 2022-05-05 NOTE — Progress Notes (Signed)
31 yo P1011 s/p D&E on 04/05/22 here for post op check. D&E performed due to abnormal pregnancy with concerns for molar pregnancy based on ultrasound findings. Patient reports feeling well since her procedure. She denies fever, pelvic pain or abnormal discharge. Patient denies any vaginal bleeding. She has started COC for contraception  Past Medical History:  Diagnosis Date   History of kidney stones 2012   passed   Medical history non-contributory    Past Surgical History:  Procedure Laterality Date   BREAST REDUCTION SURGERY Bilateral 04/2021   DILATION AND EVACUATION N/A 04/05/2022   Procedure: DILATATION AND EVACUATION;  Surgeon: Catalina Antigua, MD;  Location: [facility] OR;  Service: Gynecology;  Laterality: N/A;   OPERATIVE ULTRASOUND N/A 04/05/2022   Procedure: OPERATIVE ULTRASOUND;  Surgeon: Catalina Antigua, MD;  Location: [facility] OR;  Service: Gynecology;  Laterality: N/A;   No family history on file. Social History   Tobacco Use   Smoking status: Never   Smokeless tobacco: Never  Vaping Use   Vaping Use: Never used  Substance Use Topics   Alcohol use: No   Drug use: No   ROS See pertinent in HPI. All other systems reviewed and non contributory Blood pressure 135/79, pulse 78, height 5\' 4"  (1.626 m), weight 180 lb (81.6 kg), not currently breastfeeding. GENERAL: Well-developed, well-nourished female in no acute distress.  NEURO: alert and oriented x 3  11/22 pathology FINAL MICROSCOPIC DIAGNOSIS:   A. PRODUCTS OF CONCEPTION, DILATATION AND EVACUATION:  -  Immature chorionic villi with hydropic villi in the background of  extensive decidualized stroma and endometrium with Arias-Stella  reaction.   Note: The clinical impression of a molar pregnancy is noted.  The  majority of the villi show hydropic change with a rare variably sized  villi with questionable cisternae formation.  Prominent trophoblastic  proliferation, inclusions and atypia is not readily identified.  The   findings are suggestive of but not diagnostic morphologically for a  molar pregnancy.  Ploidy studies and p57 are pending.    A/P 31 yo here for post op check following D&E - Pathology results reviewed with the patient consistent with molar pregnancy - Discussed need for close monitoring of quant HCG for the next 3-6 months - Discussed importance of pregnancy prevention during this surveillance period and contraception options reviewed- patient wishes to continue with COC - quant HCG today - all questions were answered and patient verbalized understanding

## 2022-05-06 LAB — BETA HCG QUANT (REF LAB): hCG Quant: <1 m[IU]/mL

## 2022-05-12 ENCOUNTER — Telehealth: Payer: Self-pay

## 2022-05-12 NOTE — Telephone Encounter (Signed)
Called patient to inform her that her Quant HCG was negative. Patient is aware that she will need to return for a monthly quant for 6 months. Patient is scheduled to have a repeat Quant on 06/14/22.  Understanding was voiced. Dominque Marlin l Shilah Hefel, CMA

## 2022-05-12 NOTE — Telephone Encounter (Signed)
-----   Message from Catalina Antigua, MD sent at 05/09/2022  5:28 PM EST ----- Please inform patient of negative quant HCG. She needs to return monthly for quant HCG for 6 months. She is aware of that plan. Please schedule next blood draw end of January

## 2022-05-18 LAB — SURGICAL PATHOLOGY

## 2022-06-14 ENCOUNTER — Ambulatory Visit: Payer: 59

## 2022-06-14 DIAGNOSIS — O020 Blighted ovum and nonhydatidiform mole: Secondary | ICD-10-CM

## 2022-06-14 NOTE — Progress Notes (Signed)
Patient sent to lab for beta HCG blood draw. Kathrene Alu RN

## 2022-06-15 ENCOUNTER — Telehealth: Payer: Self-pay

## 2022-06-15 LAB — BETA HCG QUANT (REF LAB): hCG Quant: <1 m[IU]/mL

## 2022-06-15 NOTE — Telephone Encounter (Signed)
-----  Message from Mora Bellman, MD sent at 06/15/2022  8:17 AM EST ----- Patient needs to continue monthly quant HCG checks. thanks

## 2022-06-15 NOTE — Telephone Encounter (Signed)
Left message for patient that we will need another check in one month. Patient placed on schedule and to call back if she can do that date or time. Kathrene Alu RN

## 2022-07-13 ENCOUNTER — Ambulatory Visit: Payer: 59

## 2022-07-13 DIAGNOSIS — O020 Blighted ovum and nonhydatidiform mole: Secondary | ICD-10-CM

## 2022-07-13 NOTE — Progress Notes (Signed)
Patient sent to lab for follow up HCG from molar pregnancy. Kathrene Alu RN

## 2022-07-14 LAB — BETA HCG QUANT (REF LAB): hCG Quant: <1 m[IU]/mL

## 2022-08-10 ENCOUNTER — Ambulatory Visit: Payer: 59

## 2022-08-10 DIAGNOSIS — O020 Blighted ovum and nonhydatidiform mole: Secondary | ICD-10-CM

## 2022-08-10 NOTE — Progress Notes (Signed)
Patient sent to lab for hcg - follow up molar pregnancy. Kathrene Alu RN

## 2022-08-11 LAB — BETA HCG QUANT (REF LAB): hCG Quant: <1 m[IU]/mL

## 2022-08-22 ENCOUNTER — Encounter: Payer: Self-pay | Admitting: Obstetrics and Gynecology

## 2023-03-11 ENCOUNTER — Other Ambulatory Visit: Payer: Self-pay | Admitting: Obstetrics and Gynecology
